# Patient Record
Sex: Male | Born: 2000 | Race: White | Hispanic: No | Marital: Single | Smoking: Never smoker
Health system: Southern US, Community
[De-identification: ages and names within clinical notes are randomized; demographics above are authoritative.]

## PROBLEM LIST (undated history)

## (undated) DIAGNOSIS — F909 Attention-deficit hyperactivity disorder, unspecified type: Secondary | ICD-10-CM

## (undated) HISTORY — DX: Attention-deficit hyperactivity disorder, unspecified type: F90.9

---
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
## 2021-01-06 ENCOUNTER — Ambulatory Visit: Payer: 59 | Admitting: Family Medicine

## 2021-01-06 ENCOUNTER — Other Ambulatory Visit: Payer: Self-pay

## 2021-01-06 VITALS — BP 112/70 | HR 95 | Temp 97.0°F | Wt 177.0 lb

## 2021-01-06 DIAGNOSIS — R079 Chest pain, unspecified: Secondary | ICD-10-CM

## 2021-01-06 DIAGNOSIS — Z1331 Encounter for screening for depression: Secondary | ICD-10-CM | POA: Diagnosis not present

## 2021-01-06 NOTE — Progress Notes (Signed)
° °  Subjective:    Patient ID: Todd Hinton, male    DOB: [birth year], 21 y.o.   MRN: [id number]  HPI He states that last night he experienced some mid chest pain with no history of injury, coughing, fever, chills, chest congestion.  He states that now the pain is gone. At the end of the intervention he then mentioned the fact that one of his friends thinks that he might be depressed.  He admits to a 2-year history of being unhappy, irritable, intermittently crying for no good reason, anhedonia.  He did state that this got especially bad after his grandfather died approximately 1 year ago.  He notes no other inciting factors.  He does not feel suicidal.  He states no other psychological traumas.  His home life is stable.  School seems to be going well though he did note that he has had to change several of his friends.  He seems to have handled that well.   Review of Systems     Objective:   Physical Exam Alert and in no distress.  Appropriately dressed and normal affect.  Cardiac exam shows regular rhythm without murmurs or gallops.  Lungs clear to auscultation.  No palpable chest wall or sternal tenderness.       Assessment & Plan:  Chest pain, unspecified type  Depression screen I explained that there is nothing to be concerned about with the pain since it is now gone. I then spent greater than 30 minutes discussing with him his symptoms and possible causes.  Since he is going back to [place].  Encouraged him to get involved in counseling there and we can follow-up with that when he comes home.  Encouraged him to call me if he ever has any questions or concerns.

## 2021-03-11 ENCOUNTER — Other Ambulatory Visit: Payer: Self-pay | Admitting: Medical

## 2021-03-11 ENCOUNTER — Telehealth: Payer: Self-pay | Admitting: Family Medicine

## 2021-03-11 DIAGNOSIS — F901 Attention-deficit hyperactivity disorder, predominantly hyperactive type: Secondary | ICD-10-CM

## 2021-03-11 MED ORDER — AMPHETAMINE-DEXTROAMPHET ER 30 MG PO CP24: 30.0000 mg | ORAL_CAPSULE | ORAL | 0 refills | Status: AC

## 2021-03-11 NOTE — Telephone Encounter (Signed)
Pt mom called and is REQUESTING a refill on his adderall please [id number] - [place], [place] - [address] ?Pt is in town and would like to get before he goes back to college ? ?

## 2023-06-13 ENCOUNTER — Ambulatory Visit: Admitting: Family Medicine

## 2023-06-13 ENCOUNTER — Encounter: Payer: Self-pay | Admitting: Family Medicine

## 2023-06-13 VITALS — BP 110/80 | HR 72 | Wt 168.4 lb

## 2023-06-13 DIAGNOSIS — F901 Attention-deficit hyperactivity disorder, predominantly hyperactive type: Secondary | ICD-10-CM

## 2023-06-13 MED ORDER — AMPHETAMINE-DEXTROAMPHET ER 30 MG PO CP24: 30.0000 mg | ORAL_CAPSULE | ORAL | 0 refills | Status: AC

## 2023-06-13 NOTE — Progress Notes (Signed)
   Subjective:    Patient ID: Todd Hinton, male    DOB: [birth year], 23 y.o.   MRN: [id number]  HPI He is here for consult concerning ADHD.  He does have a history of this in the past however stopped taking his medication a couple years ago and would like to start back on it.  He is 1 semester away from graduating and is interested in going into law school.  He realized that he needed to work on his grades more to help get into law school.  He was taking Adderall XR 30 which lasted roughly 12 hours.  He had very little difficulty with that.   Review of Systems     Objective:    Physical Exam Alert and in no distress otherwise not examined       Assessment & Plan:  Attention-deficit hyperactivity disorder, predominantly hyperactive type - Plan: amphetamine -dextroamphetamine  (ADDERALL XR) 30 MG 24 hr capsule I will keep him on the 12-hour dose but did discuss the fact that there are other medications that are shorter acting that in the future if he would like to use them that could also be arranged.  He will keep me informed as to how often he needs these medicines.
